# Patient Record
Sex: Male | Born: 2000 | Race: White
Health system: Midwestern US, Academic
[De-identification: ages and names within clinical notes are randomized; demographics above are authoritative.]

---
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
## 2020-01-30 ENCOUNTER — Inpatient Hospital Stay: Admit: 2020-01-30 | Payer: MEDICAID

## 2020-01-30 ENCOUNTER — Encounter: Admit: 2020-01-30 | Discharge: 2020-01-30 | Payer: PRIVATE HEALTH INSURANCE

## 2020-01-30 DIAGNOSIS — R079 Chest pain, unspecified: Secondary | ICD-10-CM

## 2020-01-30 LAB — OPIATES-URINE RANDOM: Lab: NEGATIVE

## 2020-01-30 LAB — BENZODIAZEPINES-URINE RANDOM: Lab: NEGATIVE

## 2020-01-30 LAB — BARBITURATES-URINE RANDOM: Lab: NEGATIVE

## 2020-01-30 LAB — TSH WITH FREE T4 REFLEX: Lab: 3.5 uU/mL (ref 0.35–5.00)

## 2020-01-30 LAB — TROPONIN-I
Lab: 2.2 ng/mL — ABNORMAL HIGH (ref 0.0–0.05)
Lab: 1.8 ng/mL — ABNORMAL HIGH (ref 0.0–0.05)
Lab: 3.6 ng/mL — ABNORMAL HIGH (ref 0.0–0.05)

## 2020-01-30 LAB — PHENCYCLIDINES-URINE RANDOM: Lab: NEGATIVE

## 2020-01-30 LAB — COCAINE-URINE RANDOM: Lab: NEGATIVE

## 2020-01-30 LAB — CBC AND DIFF
Lab: 0 % (ref >60)
Lab: 0 10*3/uL (ref 0–0.20)
Lab: 0 10*3/uL (ref 0–0.45)
Lab: 1 % (ref >60)
Lab: 1.6 10*3/uL (ref 1.0–4.8)
Lab: 11 K/UL — ABNORMAL HIGH (ref 4.5–11.0)
Lab: 13 % (ref 11–15)
Lab: 14 % — ABNORMAL LOW (ref 24–44)
Lab: 14 g/dL (ref 13.5–16.5)
Lab: 199 K/UL (ref 150–400)
Lab: 2.3 10*3/uL — ABNORMAL HIGH (ref 0–0.80)
Lab: 20 % — ABNORMAL HIGH (ref 4–12)
Lab: 30 pg — ABNORMAL HIGH (ref 26–34)
Lab: 34 g/dL (ref 32.0–36.0)
Lab: 4.7 M/UL (ref 4.4–5.5)
Lab: 65 % — ABNORMAL LOW (ref 41–77)
Lab: 7.8 10*3/uL — ABNORMAL HIGH (ref 1.8–7.0)
Lab: 8.7 FL (ref 7–11)
Lab: 90 FL (ref 80–100)

## 2020-01-30 LAB — CANNABINOIDS-URINE RANDOM: Lab: POSITIVE — AB

## 2020-01-30 LAB — BNP (B-TYPE NATRIURETIC PEPTI): Lab: 46 pg/mL (ref 0–100)

## 2020-01-30 LAB — C REACTIVE PROTEIN (CRP): Lab: 12 mg/dL — ABNORMAL HIGH (ref <1.0)

## 2020-01-30 LAB — PROCALCITONIN: Lab: 0 ng/mL

## 2020-01-30 LAB — SED RATE: Lab: 45 mm/h — ABNORMAL HIGH (ref 0–15)

## 2020-01-30 LAB — MAGNESIUM: Lab: 2.1 mg/dL (ref 1.6–2.6)

## 2020-01-30 LAB — COMPREHENSIVE METABOLIC PANEL
Lab: 137 MMOL/L (ref 137–147)
Lab: 4 MMOL/L (ref 3.5–5.1)

## 2020-01-30 LAB — AMPHETAMINES-URINE RANDOM: Lab: NEGATIVE

## 2020-01-30 MED ORDER — IBUPROFEN 100 MG/5 ML PO SUSP
800 mg | Freq: Three times a day (TID) | ORAL | 0 refills | Status: DC
Start: 2020-01-30 — End: 2020-01-30

## 2020-01-30 MED ORDER — IBUPROFEN 600 MG PO TAB
600 mg | Freq: Three times a day (TID) | ORAL | 0 refills | Status: AC
Start: 2020-01-30 — End: ?
  Administered 2020-01-30 – 2020-02-04 (×15): 600 mg via ORAL

## 2020-01-30 MED ORDER — FLU VACC QS2021-22 6MOS UP(PF) 60 MCG (15 MCG X 4)/0.5 ML IM SYRG
.5 mL | Freq: Once | INTRAMUSCULAR | 0 refills | Status: AC
Start: 2020-01-30 — End: ?

## 2020-01-30 MED ORDER — ENOXAPARIN 40 MG/0.4 ML SC SYRG
40 mg | Freq: Every day | SUBCUTANEOUS | 0 refills | Status: AC
Start: 2020-01-30 — End: ?
  Administered 2020-01-30 – 2020-02-04 (×6): 40 mg via SUBCUTANEOUS

## 2020-01-30 MED ORDER — FENTANYL CITRATE (PF) 50 MCG/ML IJ SOLN
25 ug | Freq: Once | INTRAVENOUS | 0 refills | Status: CP
Start: 2020-01-30 — End: ?
  Administered 2020-01-30: 22:00:00 25 ug via INTRAVENOUS

## 2020-01-30 MED ORDER — COLCHICINE 0.6 MG PO TAB
0.6 mg | Freq: Two times a day (BID) | ORAL | 0 refills | Status: AC
Start: 2020-01-30 — End: ?
  Administered 2020-01-30 – 2020-02-04 (×11): 0.6 mg via ORAL

## 2020-01-30 MED ORDER — PANTOPRAZOLE 40 MG PO TBEC
40 mg | Freq: Every day | ORAL | 0 refills | Status: AC
Start: 2020-01-30 — End: ?
  Administered 2020-01-31 – 2020-02-04 (×4): 40 mg via ORAL

## 2020-01-30 NOTE — Progress Notes
The patient was seen at [facility] in 07/2018 for similar sx's, they patient did not follow up as directed.  Has had chest pain intermittently since then.  More constant the past 2 days.  EKG: early repole changes in V1-2-3, st elevation present but better than last EKG, I requested EKG be clouded.  AAOX3  116/78 109 17 98% on RA 100.1  COVID is pending- sending concerned the patient may have COVID, I explained we do not have any COVID beds so will need to wait for COVID result.  CXR-right lower lobe gorund glass opacity  WBC-14,1  H/H-15/43 Plt ct-194  Trop-0.335 (their norm is <0.033)  NA-137 K-3.8 CL-102 CO2-21 Gap-18 BUN-10 Creat-0.98  TX: IVF  HX: myopericarditis; vaping

## 2020-01-30 NOTE — H&P (View-Only)
Admission History and Physical Examination      Name:  Brandon Harmon                                             MRN:  [id number]   Admission Date:  01/30/2020                     Assessment/Plan:    Principal Problem:    Chest pain    Mr. Caffrey is a 19 year old gentlemen with past medical history of myopericarditis who present to [facility] via transfer p/w chest pain and elevated troponins     Chest pain  Elevated troponin  Hx myopericarditis  - OSH labs: trop 0.335 (norm is <0.033)  - OSH imaging:   CXR-right lower lobe groundglass opacity   CTA chest negative for PE or aortic dissection  - Admitted with similar presentation in April of 2020 found to have myopericarditis. Workup included ESR of 10, CRP 3.14, negative HIV, negative RVP, negative COVID-19, ANA < 80  - Treated with aspirin; started on metoprolol 12.5 mg twice daily  - Echocardiogram 08/27/18 - LVEF 55%. No major regional wall motion abnormalities seen, cannot exclude mild lateral hypokinesis.  - ESR 45, CRP 12.28  - TSH wnl  - UDS pending  - Trop 2.29  - EKG 10/1@0616 : Rate 72, sinus rhythm, normal axis, normal intervals. No evidence of ischemia  Plan  > UDS  > Trend trops  > Cardiac MRI to evaluate for evidence of myopericarditis  > Empirically treat for myopericarditis with ibuprofen 600mg  TID, colchicine 0.6mg  BID    Leukocytosis  Lymphadenopathy  RLL consolidation  - Presented to OSH with chills, body aches, sore throat, lymphadenopathy  - WBC 14.1 w neutrophilia at OSH  - ESR 45, CRP 12.28  Plan  > RVP  > Procalcitonin  > will hold on starting antibiotics until further workup    FEN: No IVF; replace PRN; cardiac diet  CODE: FULL CODE  DVT: enoxaparin  Dispo: admit to [facility]   __________________________________________________________________________________  Primary Care Physician: No Pcp, Na  Verified    Chief Complaint:  Chest pain  History of Present Illness: Brandon Harmon is a 19 y.o. gentlemen with past medical history of myopericarditis who present to [facility] via transfer with complaints of chest pains and elevated troponins.     He presented to OSH feeling run down with several days of chills, body aches, sore throat, swollen neck glands, as well as ongoing chest pain for 1.5 months that worsened in the last few days. His work-up was notable for WBC 14.1, elevated troponin to 0.335 (normal 0.0-0.033), elevated dimer 688 with negative follow-up CTA chest, CXR with evidence of RLL developing infiltrate. EKG showed sinus tachycardia without evidence of ischemic changes.    Patient with similar presentation in April of 2020, where at that point troponins elevated to 13.98.  Extensive work-up consistent with acute myopericarditis treated with aspirin.  Unfortunately patient lost to follow-up.  He states that since that time he continued to have intermittent substernal chest pain though notes a stark worsening over the past 2 days. Admits this is similar to his previous presentation.  He describes chest pain/chest discomfort with a squeezing intensity.  Seems to be made worse when supine and relieved when leaning forward or prone. This is not associated with any dyspnea or  shortness of air.  He also denies palpitations.    He denies any significant past medical history.    He denies any significant family history of cardiac disease.  He reports occasional vaping.  No other illicit drug use.  He does not smoke tobacco.  He denies alcohol use.  Social History     Socioeconomic History   ? Marital status: Unknown     Spouse name: Not on file   ? Number of children: Not on file   ? Years of education: Not on file   ? Highest education level: Not on file   Occupational History   ? Not on file   Tobacco Use   ? Smoking status: Never Smoker   Vaping Use   ? Vaping Use: Some days   Substance and Sexual Activity   ? Alcohol use: Not Currently   ? Drug use: Not Currently   ? Sexual activity: Not on file   Other Topics Concern   ? Not on file Social History Narrative   ? Not on file     Social Determinants of Health     Financial Resource Strain:    ? Difficulty of Paying Living Expenses:    Food Insecurity:    ? Worried About Programme researcher, broadcasting/film/video in the Last Year:    ? Barista in the Last Year:    Transportation Needs:    ? Freight forwarder (Medical):    ? Lack of Transportation (Non-Medical):    Physical Activity:    ? Days of Exercise per Week:    ? Minutes of Exercise per Session:    Stress:    ? Feeling of Stress :    Social Connections:    ? Frequency of Communication with Friends and Family:    ? Frequency of Social Gatherings with Friends and Family:    ? Attends Religious Services:    ? Active Member of Clubs or Organizations:    ? Attends Banker Meetings:    ? Marital Status:    Intimate Partner Violence:    ? Fear of Current or Ex-Partner:    ? Emotionally Abused:    ? Physically Abused:    ? Sexually Abused:       Vaping/E-liquid Use   ? Vaping Use Current Some Day User                 Immunizations (includes history and patient reported):   There is no immunization history on file for this patient.        Allergies:  Patient has no allergy information on record.    Medications:  No medications prior to admission.     Current Facility-Administered Medications   Medication   ? colchicine (COLCRYS) tablet 0.6 mg   ? enoxaparin (LOVENOX) syringe 40 mg   ? ibuprofen (MOTRIN) tablet 600 mg   ? influenza (=>6 MO) (QUADrivalent) (FLULAVAL) 60 mcg (15 mcg x 4)/0.5 mL 2021-22 PF syringe 0.5 mL   ? pantoprazole DR (PROTONIX) tablet 40 mg     Review of Systems:  All other systems reviewed and are negative.    Physical Exam:  Vital Signs: Last Filed In 24 Hours Vital Signs: 24 Hour Range   BP: 117/80 (10/01 0559)  Temp: 36.7 ?C (98.1 ?F) (10/01 0559)  Pulse: 78 (10/01 0559)  Respirations: 16 PER MINUTE (10/01 0559)  Height: 177.8 cm (70) (10/01 0559) BP: (117)/(80)   Temp:  [36.7 ?  C (98.1 ?F)]   Pulse:  [78]   Respirations: [16 PER MINUTE]           Physical Exam  Vitals and nursing note reviewed.   Constitutional:       General: He is not in acute distress.     Appearance: Normal appearance.   HENT:      Head: Normocephalic and atraumatic.   Eyes:      General: No scleral icterus.     Pupils: Pupils are equal, round, and reactive to light.   Neck:      Vascular: No JVD.   Cardiovascular:      Rate and Rhythm: Normal rate and regular rhythm.      Heart sounds: No murmur heard.   No friction rub. No gallop.    Pulmonary:      Effort: Pulmonary effort is normal. No respiratory distress.      Breath sounds: No wheezing or rales.   Chest:      Chest wall: No tenderness.   Abdominal:      General: Bowel sounds are normal. There is no distension.      Palpations: Abdomen is soft.      Tenderness: There is no abdominal tenderness.   Musculoskeletal:         General: No swelling or tenderness.      Cervical back: Normal range of motion and neck supple.   Lymphadenopathy:      Cervical: Cervical adenopathy present.   Skin:     General: Skin is warm and dry.   Neurological:      General: No focal deficit present.      Mental Status: He is alert and oriented to person, place, and time. Mental status is at baseline.      Cranial Nerves: No cranial nerve deficit.      Sensory: No sensory deficit.      Motor: No weakness.   Psychiatric:         Mood and Affect: Affect normal.         Judgment: Judgment normal.           Lab/Radiology/Other Diagnostic Tests:  24-hour labs:    Results for orders placed or performed during the hospital encounter of 01/30/20 (from the past 24 hour(s))   BNP (B-TYPE NATRIURETIC PEPTI)    Collection Time: 01/30/20  6:55 AM   Result Value Ref Range    B Type Natriuretic Peptide 46.0 0 - 100 PG/ML   C REACTIVE PROTEIN (CRP)    Collection Time: 01/30/20  6:55 AM   Result Value Ref Range    C-Reactive Protein 12.28 (H) <1.0 MG/DL   SED RATE    Collection Time: 01/30/20  6:55 AM   Result Value Ref Range    Sed Rate -ESR 45 (H) 0 - 15 MM/HR   TSH WITH FREE T4 REFLEX    Collection Time: 01/30/20  6:55 AM   Result Value Ref Range    TSH 3.51 0.35 - 5.00 MCU/ML   TROPONIN-I    Collection Time: 01/30/20  6:55 AM   Result Value Ref Range    Troponin-I 2.29 (H) 0.0 - 0.05 NG/ML        Pertinent radiology reviewed., EKG Reviewed    [place], MD

## 2020-01-31 ENCOUNTER — Inpatient Hospital Stay: Admit: 2020-01-31 | Discharge: 2020-01-31 | Payer: PRIVATE HEALTH INSURANCE

## 2020-01-31 LAB — TROPONIN-I: Lab: 6.3 ng/mL — ABNORMAL HIGH (ref 0.0–0.05)

## 2020-01-31 MED ADMIN — GADOBENATE DIMEGLUMINE 529 MG/ML (0.1MMOL/0.2ML) IV SOLN [135881]: 15 mL | INTRAVENOUS | @ 15:00:00 | Stop: 2020-01-31 | NDC 00270516414

## 2020-02-01 ENCOUNTER — Encounter: Admit: 2020-02-01 | Discharge: 2020-02-01

## 2020-02-01 MED ADMIN — POTASSIUM CHLORIDE 20 MEQ PO TBTQ [35943]: 40 meq | ORAL | @ 12:00:00 | Stop: 2020-02-01 | NDC 00245531989

## 2020-02-01 MED ADMIN — PHENOL 1.4 % MM SPRA [82893]: 2 | OROMUCOSAL | @ 20:00:00 | NDC 00904630521

## 2020-02-01 MED ADMIN — LIDOCAINE 5 % TP PTMD [80759]: 1 | TOPICAL | @ 18:00:00 | NDC 00591352530

## 2020-02-01 MED ADMIN — ACETAMINOPHEN 325 MG PO TAB [101]: 650 mg | ORAL | @ 18:00:00 | NDC 00904677361

## 2020-02-02 ENCOUNTER — Inpatient Hospital Stay: Admit: 2020-02-02 | Discharge: 2020-02-02 | Payer: PRIVATE HEALTH INSURANCE

## 2020-02-03 ENCOUNTER — Encounter: Admit: 2020-02-03 | Discharge: 2020-02-03 | Payer: PRIVATE HEALTH INSURANCE

## 2020-02-03 ENCOUNTER — Inpatient Hospital Stay: Admit: 2020-02-03 | Discharge: 2020-02-03 | Payer: PRIVATE HEALTH INSURANCE

## 2020-02-03 MED ORDER — PHENYLEPHRINE HCL IN 0.9% NACL 1 MG/10 ML (100 MCG/ML) IV SYRG
INTRAVENOUS | 0 refills | Status: DC
Start: 2020-02-03 — End: 2020-02-03
  Administered 2020-02-03 (×5): 100 ug via INTRAVENOUS

## 2020-02-03 MED ORDER — DEXMEDETOMIDINE IN 0.9 % NACL 20 MCG/5 ML (4 MCG/ML) IV SYRG
INTRAVENOUS | 0 refills | Status: DC
Start: 2020-02-03 — End: 2020-02-03
  Administered 2020-02-03: 21:00:00 4 ug via INTRAVENOUS
  Administered 2020-02-03 (×2): 8 ug via INTRAVENOUS

## 2020-02-03 MED ORDER — ONDANSETRON HCL (PF) 4 MG/2 ML IJ SOLN
INTRAVENOUS | 0 refills | Status: DC
Start: 2020-02-03 — End: 2020-02-03
  Administered 2020-02-03: 20:00:00 4 mg via INTRAVENOUS

## 2020-02-03 MED ORDER — DEXAMETHASONE SODIUM PHOSPHATE 4 MG/ML IJ SOLN
INTRAVENOUS | 0 refills | Status: DC
Start: 2020-02-03 — End: 2020-02-03
  Administered 2020-02-03: 20:00:00 4 mg via INTRAVENOUS

## 2020-02-03 MED ORDER — MIDAZOLAM 1 MG/ML IJ SOLN
INTRAVENOUS | 0 refills | Status: DC
Start: 2020-02-03 — End: 2020-02-03
  Administered 2020-02-03: 20:00:00 2 mg via INTRAVENOUS

## 2020-02-03 MED ORDER — ROCURONIUM 10 MG/ML IV SOLN GROUP
INTRAVENOUS | 0 refills | Status: DC
Start: 2020-02-03 — End: 2020-02-03
  Administered 2020-02-03: 20:00:00 50 mg via INTRAVENOUS

## 2020-02-03 MED ORDER — SUGAMMADEX 100 MG/ML IV SOLN
INTRAVENOUS | 0 refills | Status: DC
Start: 2020-02-03 — End: 2020-02-03
  Administered 2020-02-03: 20:00:00 160 mg via INTRAVENOUS

## 2020-02-03 MED ORDER — ARTIFICIAL TEARS SINGLE DOSE DROPS GROUP
OPHTHALMIC | 0 refills | Status: DC
Start: 2020-02-03 — End: 2020-02-03
  Administered 2020-02-03: 20:00:00 1 [drp] via OPHTHALMIC

## 2020-02-03 MED ORDER — CEFAZOLIN 1 GRAM IJ SOLR
INTRAVENOUS | 0 refills | Status: DC
Start: 2020-02-03 — End: 2020-02-03
  Administered 2020-02-03: 20:00:00 2 g via INTRAVENOUS

## 2020-02-03 MED ORDER — FENTANYL CITRATE (PF) 50 MCG/ML IJ SOLN
INTRAVENOUS | 0 refills | Status: DC
Start: 2020-02-03 — End: 2020-02-03
  Administered 2020-02-03: 20:00:00 100 ug via INTRAVENOUS

## 2020-02-03 MED ORDER — PROPOFOL INJ 10 MG/ML IV VIAL
INTRAVENOUS | 0 refills | Status: DC
Start: 2020-02-03 — End: 2020-02-03
  Administered 2020-02-03: 20:00:00 200 mg via INTRAVENOUS

## 2020-02-03 MED ORDER — LIDOCAINE (PF) 200 MG/10 ML (2 %) IJ SYRG
INTRAVENOUS | 0 refills | Status: DC
Start: 2020-02-03 — End: 2020-02-03
  Administered 2020-02-03: 20:00:00 80 mg via INTRAVENOUS

## 2020-02-03 MED ADMIN — BUPIVACAINE HCL 0.25 % (2.5 MG/ML) IJ SOLN [1222]: 5 mL | INTRAMUSCULAR | @ 21:00:00 | Stop: 2020-02-03 | NDC 63323046501

## 2020-02-03 MED ADMIN — LACTATED RINGERS IV SOLP [4318]: 1000 mL | INTRAVENOUS | @ 19:00:00 | Stop: 2020-02-03 | NDC 00338011704

## 2020-02-03 MED ADMIN — ACETAMINOPHEN 500 MG PO TAB [102]: 1000 mg | ORAL | @ 19:00:00 | Stop: 2020-02-03 | NDC 00904673061

## 2020-02-03 MED ADMIN — LIDOCAINE HCL 10 MG/ML (1 %) IJ SOLN [4452]: 5 mL | INTRAMUSCULAR | @ 21:00:00 | Stop: 2020-02-03 | NDC 00409427617

## 2020-02-03 NOTE — Anesthesia Post-Procedure Evaluation
Post-Anesthesia Evaluation    Name: Brandon Harmon      MRN: [id number]     DOB: [birth year]     Age: 19 y.o.     Sex: male   __________________________________________________________________________     Procedure Information     Anesthesia Start Date/Time: 02/03/20 1437    Procedure: BIOPSY/ EXCISION LYMPH NODE DEEP CERVICAL (Left Neck) - billing area: [facility]    Location: MAIN OR 06 / Main OR/Periop    Surgeons: Robbi Garter, MD          Post-Anesthesia Vitals  BP: 106/71 (10/05 1641)  Temp: 36.6 ?C (97.9 ?F) (10/05 1556)  Pulse: 75 (10/05 1630)  Respirations: 15 PER MINUTE (10/05 1630)  SpO2: 99 % (10/05 1630)  SpO2 Pulse: 75 (10/05 1630)   Vitals Value Taken Time   BP 106/71 02/03/20 1641   Temp 36.6 ?C (97.9 ?F) 02/03/20 1556   Pulse 75 02/03/20 1630   Respirations 15 PER MINUTE 02/03/20 1630   SpO2 99 % 02/03/20 1630   ABP     ART BP           Post Anesthesia Evaluation Note    Evaluation location: Pre/Post  Patient participation: recovered; patient participated in evaluation  Level of consciousness: alert    Pain score: 0  Pain management: adequate    Hydration: normovolemia  Temperature: 36.0?C - 38.4?C  Airway patency: adequate    Perioperative Events       Post-op nausea and vomiting: no PONV    Postoperative Status  Cardiovascular status: hemodynamically stable  Respiratory status: spontaneous ventilation  Follow-up needed: none        Perioperative Events  Perioperative Event: No  Emergency Case Activation: No

## 2020-02-03 NOTE — Research Notes
Study Title: Histoplasmosis IGRA  HSC/IRB Number: [id number]  Consent Version Date: 09/12/2019  Consent Type: Initial    Clinical trial participation and research nature of the trial were discussed with subject during this visit. Subject was alert and oriented during consent discussion. Subject was informed that clinical trial is voluntary and he may withdraw consent at any time for any reason by notifying study team. Study purpose, procedures, tests, samples to be obtained, potential side effects, benefits, foreseeable risks and duration of study were discussed. HIPAA information, and compensation were discussed per consent form. There is no need for insurance pre-certification alternative courses of treatment were not discussed as this study involves no treatment. Subject verbalized understanding.     Subject was given time to review the consent form and to discuss participation in this study. Questions asked were answered to his satisfaction and subject voiced desire to sign consent today. Subject signed consent without coercion and undue influence. A copy of the signed consent was given to the subject and emailed  Health Information Management (HIM) for scanning into the subject's medical record in Epic O[employer]. Contact information for the study team was given to the subject.     No research specific procedures took place prior to consenting.

## 2020-02-03 NOTE — Anesthesia Pre-Procedure Evaluation
Anesthesia Pre-Procedure Evaluation    Name: Brandon Harmon      MRN: [id number]     DOB: 2000/11/26     Age: 19 y.o.     Sex: male   _________________________________________________________________________     Procedure Info:   Procedure Information     Date/Time: 02/03/20 1410    Procedure: BIOPSY/ EXCISION LYMPH NODE DEEP CERVICAL (Left )    Location: MAIN OR 06 / Main OR/Periop    Surgeons: Robbi Garter, MD          Physical Assessment  Vital Signs (last filed in past 24 hours):  BP: 122/74 (10/05 1322)  Temp: 36.7 ?C (98.1 ?F) (10/05 1322)  Pulse: 82 (10/05 1322)  Respirations: 13 PER MINUTE (10/05 1322)  SpO2: 100 % (10/05 1322)      Patient History   No Known Allergies     Current Medications    Not on File         Review of Systems/Medical History        PONV Screening: Non-smoker  No history of anesthetic complications      Airway - negative        Pulmonary       Current smoker (once every two weeks)        No indications/hx of asthma    no COPD      No sleep apnea      Cardiovascular         Exercise tolerance: >4 METS      Beta Blocker therapy: No      No hypertension,       No past MI,       No PTCA      No angina      Myopercarditis, elevated troponins on admission      GI/Hepatic/Renal         No GERD,       No hx of liver disease     No renal disease      Neuro/Psych       No seizures      No CVA      Musculoskeletal - negative        Endocrine/Other       No diabetes   Physical Exam    Airway Findings      Mallampati: II      TM distance: >3 FB      Neck ROM: full      Mouth opening: good    Cardiovascular Findings:       Rhythm: regular      Rate: normal    Pulmonary Findings:       Breath sounds clear to auscultation.    Abdominal Findings:       Not obese    Neurological Findings:       Alert and oriented x 3    Constitutional findings:       No acute distress       Diagnostic Tests  Hematology:   Lab Results   Component Value Date    HGB 13.3 02/03/2020    HCT 39.3 02/03/2020    PLTCT 267 02/03/2020    WBC 8.6 02/03/2020    NEUT 61 02/03/2020    ANC 5.26 02/03/2020    ALC 2.02 02/03/2020    MONA 14 02/03/2020    AMC 1.20 02/03/2020    EOSA 1 02/03/2020    ABC 0.04 02/03/2020    MCV 91.0 02/03/2020  MCH 30.7 02/03/2020    MCHC 33.8 02/03/2020    MPV 7.8 02/03/2020    RDW 13.5 02/03/2020         General Chemistry:   Lab Results   Component Value Date    NA 140 02/03/2020    K 3.8 02/03/2020    CL 105 02/03/2020    CO2 26 02/03/2020    GAP 9 02/03/2020    BUN 10 02/03/2020    CR 0.75 02/03/2020    GLU 90 02/03/2020    CA 9.0 02/03/2020    ALBUMIN 3.8 02/03/2020    MG 2.1 02/03/2020    TOTBILI 0.2 02/03/2020      Coagulation:   Lab Results   Component Value Date    PTT 29.5 08/26/2018    INR 1.0 08/26/2018         Anesthesia Plan    ASA score: 2   Plan: general and MAC  Induction method: intravenous  NPO status: acceptable      Informed Consent  Anesthetic plan and risks discussed with patient.  Use of blood products discussed with patient  Blood Consent: consented      Plan discussed with: anesthesiologist and CRNA.

## 2020-02-04 ENCOUNTER — Encounter: Admit: 2020-02-04 | Discharge: 2020-02-04 | Payer: PRIVATE HEALTH INSURANCE

## 2020-02-04 MED ADMIN — ERGOCALCIFEROL (VITAMIN D2) 1,250 MCG (50,000 UNIT) PO CAP [81876]: 50000 [IU] | ORAL | @ 15:00:00 | Stop: 2020-02-04 | NDC 64380073706

## 2020-02-04 MED FILL — IBUPROFEN 600 MG PO TAB: 600 mg | ORAL | 14 days supply | Qty: 28 | Fill #1 | Status: CP

## 2020-02-04 MED FILL — ERGOCALCIFEROL (VITAMIN D2) 1,250 MCG (50,000 UNIT) PO CAP: 1250 mcg (50,000 unit) | ORAL | 28 days supply | Qty: 12 | Fill #1 | Status: CP

## 2020-02-04 MED FILL — COLCHICINE 0.6 MG PO TAB: 0.6 mg | ORAL | 30 days supply | Qty: 180 | Fill #1 | Status: CP

## 2020-02-04 MED FILL — PANTOPRAZOLE 40 MG PO TBEC: 40 mg | ORAL | 14 days supply | Qty: 14 | Fill #1 | Status: CP

## 2020-02-05 ENCOUNTER — Encounter: Admit: 2020-02-05 | Discharge: 2020-02-05 | Payer: PRIVATE HEALTH INSURANCE

## 2020-02-11 ENCOUNTER — Encounter: Admit: 2020-02-11 | Discharge: 2020-02-11 | Payer: PRIVATE HEALTH INSURANCE

## 2020-02-12 ENCOUNTER — Encounter: Admit: 2020-02-12 | Discharge: 2020-02-12 | Payer: PRIVATE HEALTH INSURANCE

## 2020-02-12 NOTE — Telephone Encounter
02/12/20 called pt to get records informational. All numbers in chart have been disconnected edh

## 2020-02-19 ENCOUNTER — Encounter: Admit: 2020-02-19 | Discharge: 2020-02-19 | Payer: PRIVATE HEALTH INSURANCE

## 2020-02-19 DIAGNOSIS — R590 Localized enlarged lymph nodes: Secondary | ICD-10-CM

## 2020-02-19 DIAGNOSIS — I319 Disease of pericardium, unspecified: Secondary | ICD-10-CM

## 2020-02-20 ENCOUNTER — Ambulatory Visit: Admit: 2020-02-20 | Discharge: 2020-02-20 | Payer: PRIVATE HEALTH INSURANCE

## 2020-02-20 ENCOUNTER — Encounter: Admit: 2020-02-20 | Discharge: 2020-02-20 | Payer: PRIVATE HEALTH INSURANCE

## 2020-02-26 ENCOUNTER — Encounter: Admit: 2020-02-26 | Discharge: 2020-02-26 | Payer: PRIVATE HEALTH INSURANCE

## 2020-03-05 ENCOUNTER — Encounter: Admit: 2020-03-05 | Discharge: 2020-03-05 | Payer: PRIVATE HEALTH INSURANCE

## 2020-03-08 ENCOUNTER — Encounter: Admit: 2020-03-08 | Discharge: 2020-03-08 | Payer: PRIVATE HEALTH INSURANCE

## 2020-03-11 ENCOUNTER — Encounter: Admit: 2020-03-11 | Discharge: 2020-03-11 | Payer: PRIVATE HEALTH INSURANCE

## 2020-03-11 NOTE — Telephone Encounter
-----   Message from Valora Piccolo, RN sent at 03/10/2020 12:34 PM CST -----    ----- Message -----  From: Lessie Dings, MD  Sent: 03/10/2020  12:24 PM CST  To: [facility]vm Nurse Gen Card Team [place]    Please call patient with results of his resting echo Doppler study.  Overall the echo looks very good.  He missed his appointment with me earlier this week.  Please strongly recommend that he keeps his appointment with rheumatology on Friday.  ----- Message -----  From: Vena Austria, MD  Sent: 02/20/2020   3:44 PM CST  To: Lessie Dings, MD

## 2020-03-11 NOTE — Telephone Encounter
Results and recommendations called to patient lmom requested call back if questions

## 2020-03-30 ENCOUNTER — Encounter: Admit: 2020-03-30 | Discharge: 2020-03-30 | Payer: PRIVATE HEALTH INSURANCE

## 2022-04-15 IMAGING — CR CHEST
1 series · 1 of 1 positions shown · non-contrast
Comparison: none

PROCEDURE: CHEST
HISTORY: Patient has complaints of chest pain. Former smoker. Previous CXR 08/26/2018. TB

[chest ap grid]
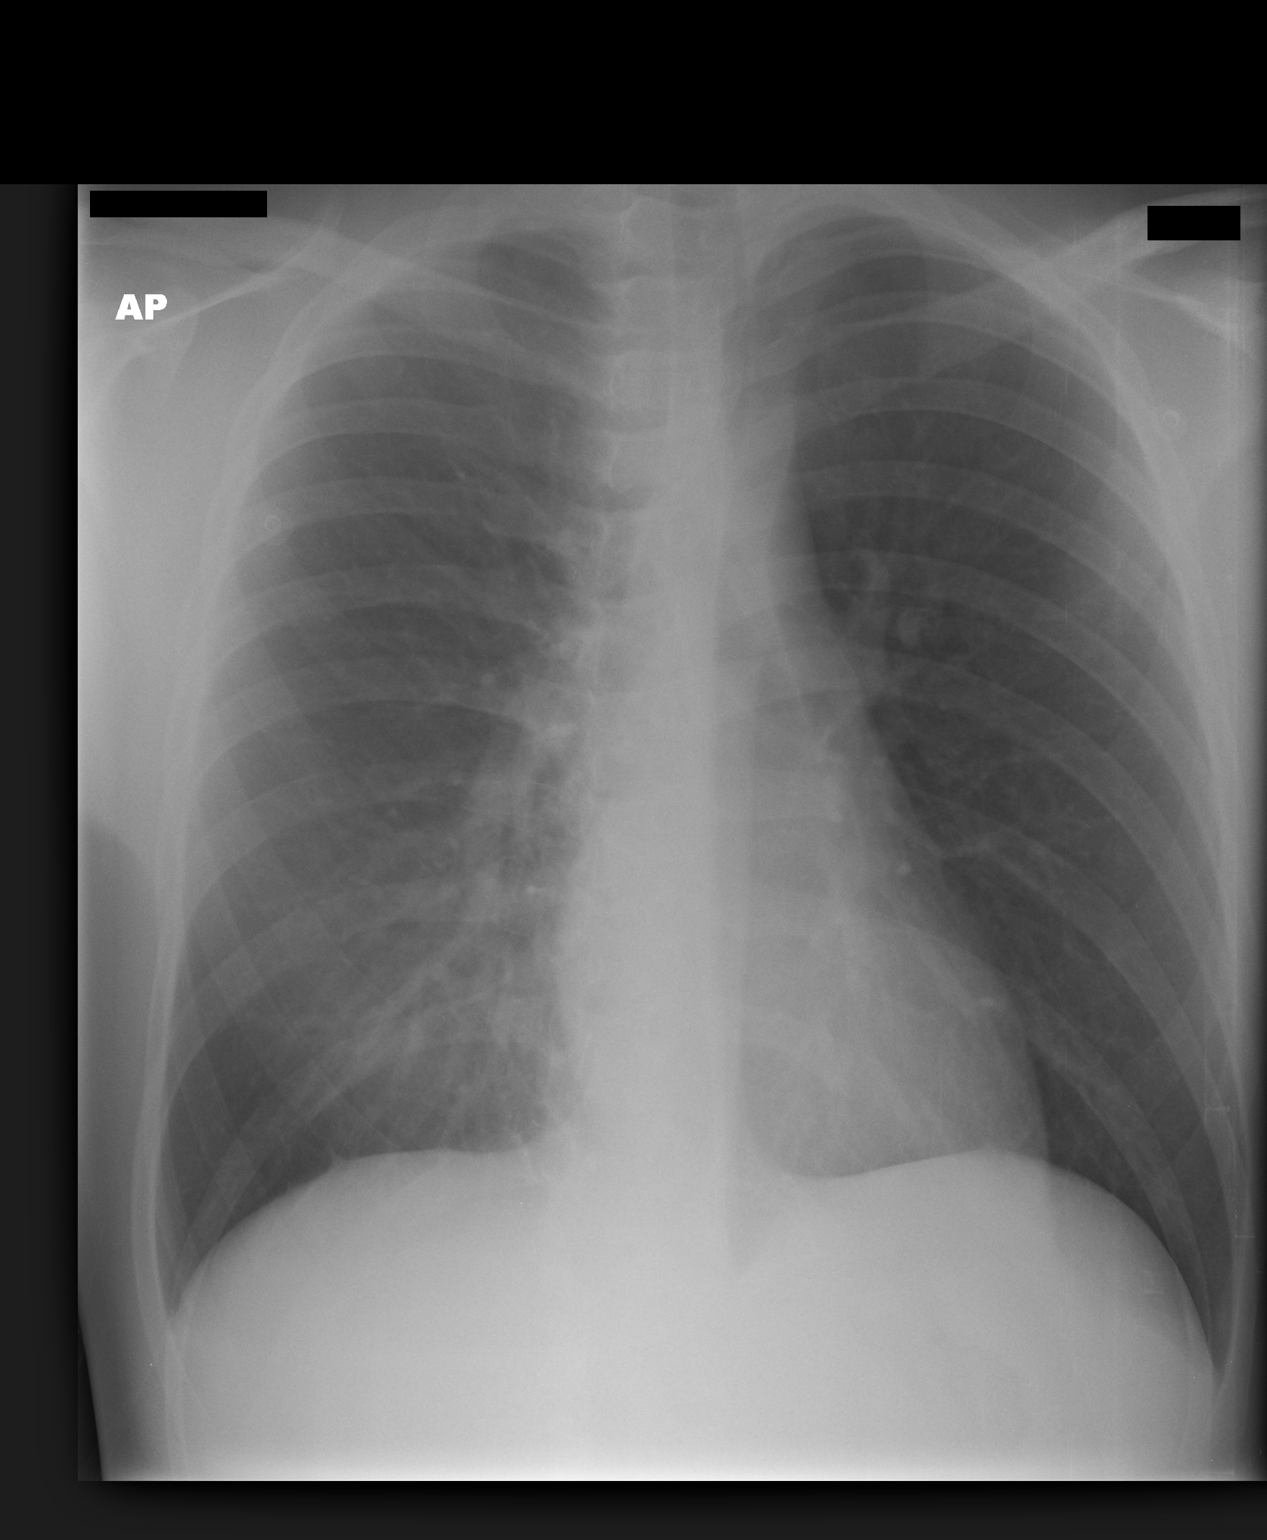

[1 of 1 positions shown; findings below may reference images not displayed]

FINDINGS: Single view of the chest without comparison shows the cardiac silhouette is normal size.
There is
no pulmonary vascular congestion seen. There are subtle groundglass airspace opacities seen in the
right
lower lobe. No pneumothorax is seen. No pleural effusion is seen. The visualized osseous structures
show
no acute findings.
IMPRESSION: 1. Right lower lobe groundglass opacities suggestive of developing infiltrate, follow up
recommended.
2. No pleural effusion.

Tech Notes:

Patient has complaints of chest pain. Former smoker. Previous CXR 08/26/2018. TB

## 2022-04-15 IMAGING — CT PE(Adult)
2 of 4 series · 15 of 36 positions shown · IV contrast (omnipaque)
Comparison: none

PROCEDURE: PE(Adult)
HISTORY: Chest pain and elevated troponin levels. Former smoker. Creat: 0.98 GFR: 126.2. Given
omnipaque 350. CT/NM 0/0. TB
TECHNIQUE: Axial CT imaging of the chest was performed with IV contrast. Comparison study 2 --.

[Series 9: cta pulmonary cor 2.00 bv36 s3 · coronal · 0.70mm/px · 3 of 129 slices shown]
[im 26/129  mediastinal]
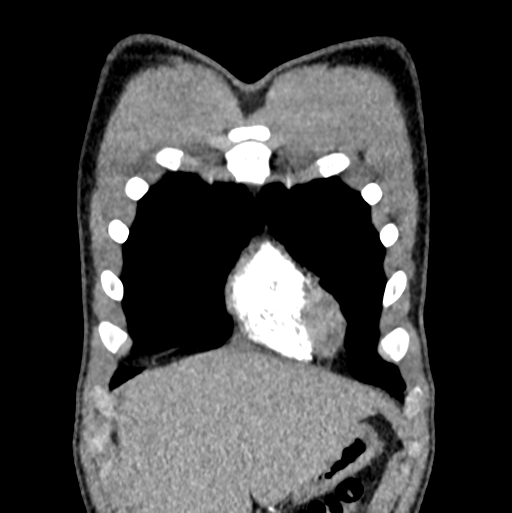
[im 52/129  mediastinal]
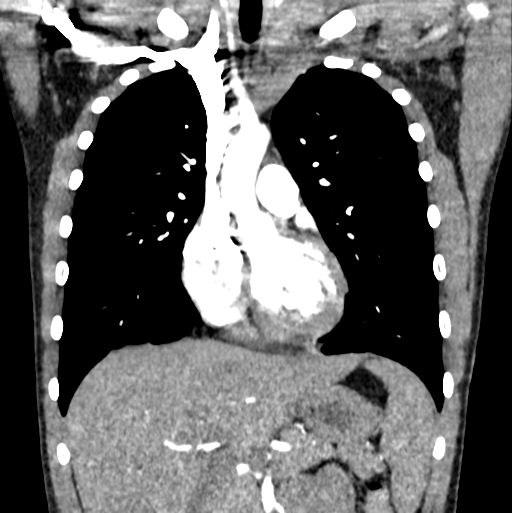
[im 77/129  mediastinal]
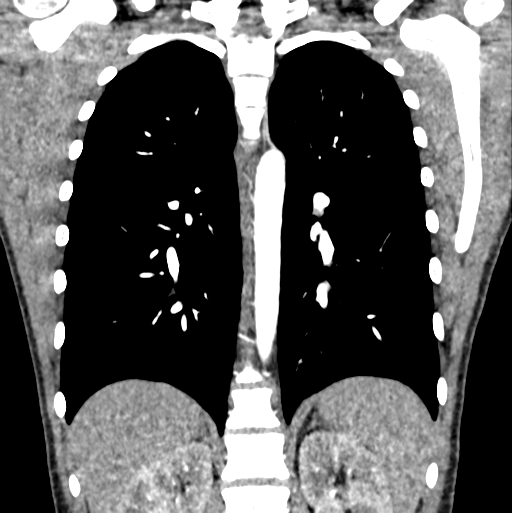

[Series 13: cta pulmonary ax 1.50 br60 s3 · axial · 0.51mm/px · z∈[+1485,+1795]mm · 12 of 232 slices shown]
[im 16/232  lung]
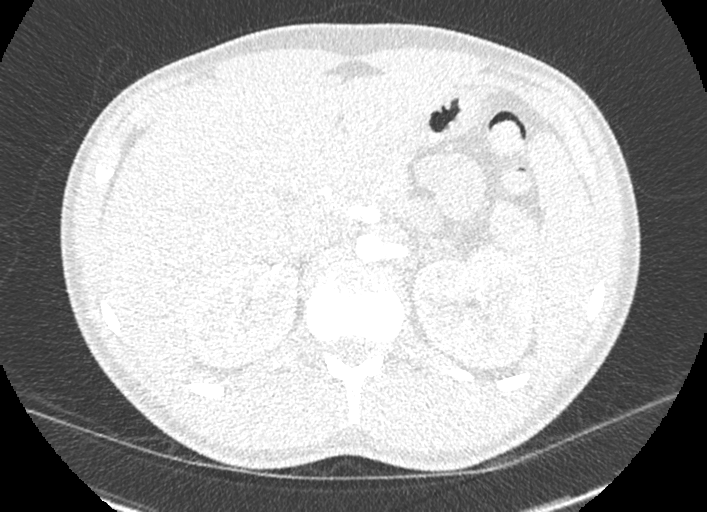
[im 31/232  mediastinal]
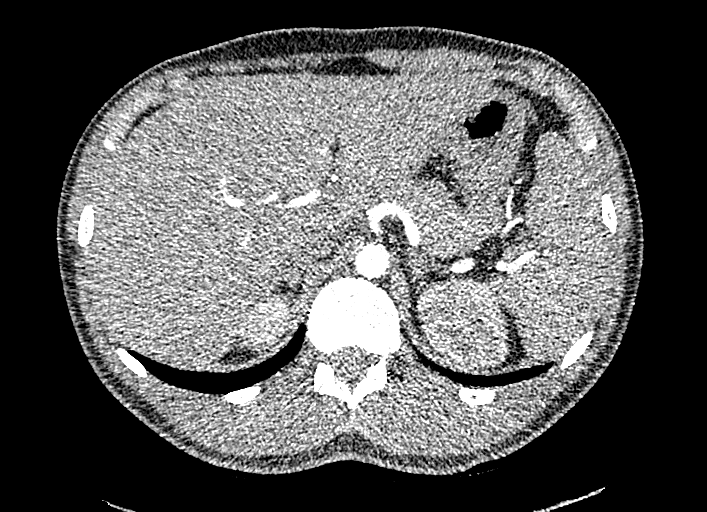
[im 47/232  lung]
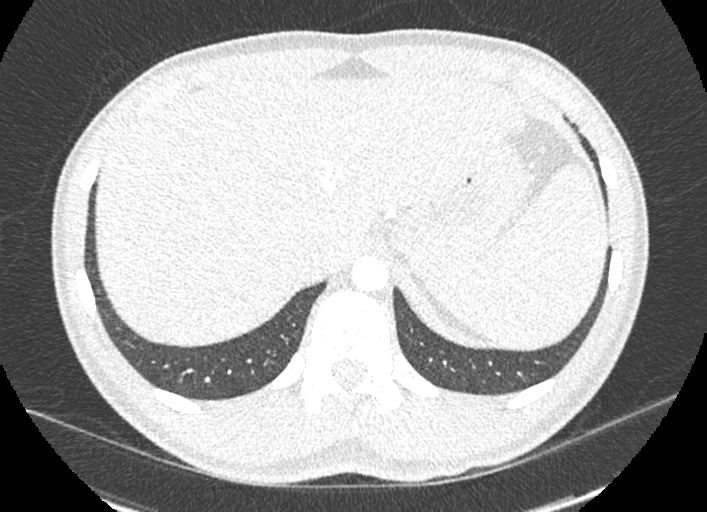
[im 78/232  mediastinal]
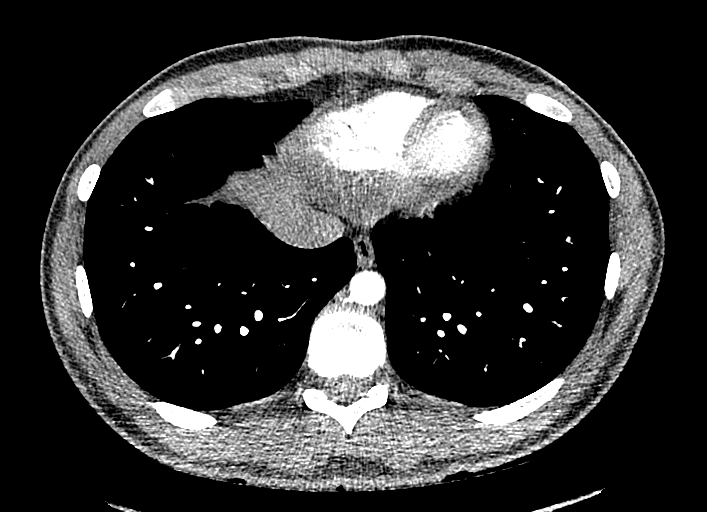
[im 93/232  lung]
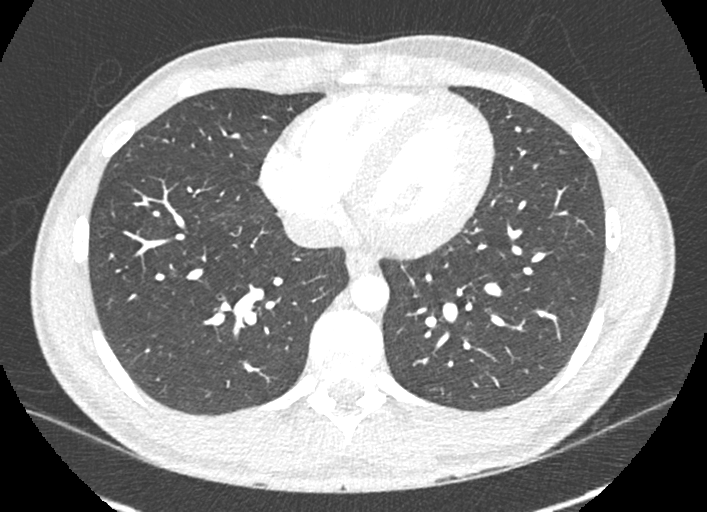
[im 108/232  mediastinal]
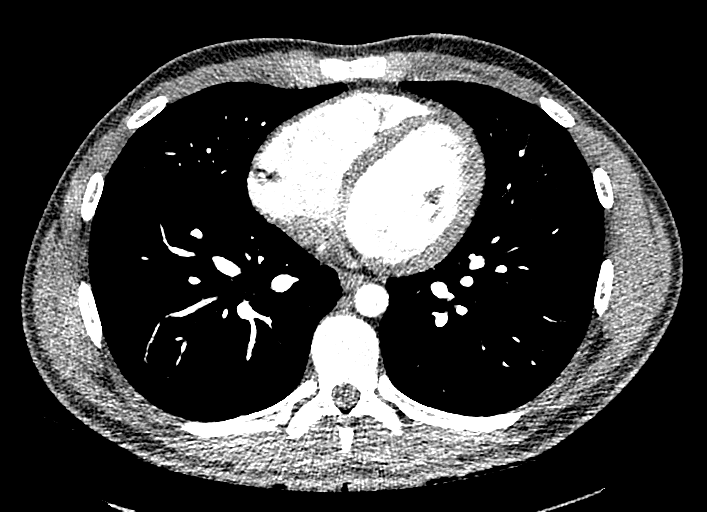
[im 124/232  lung]
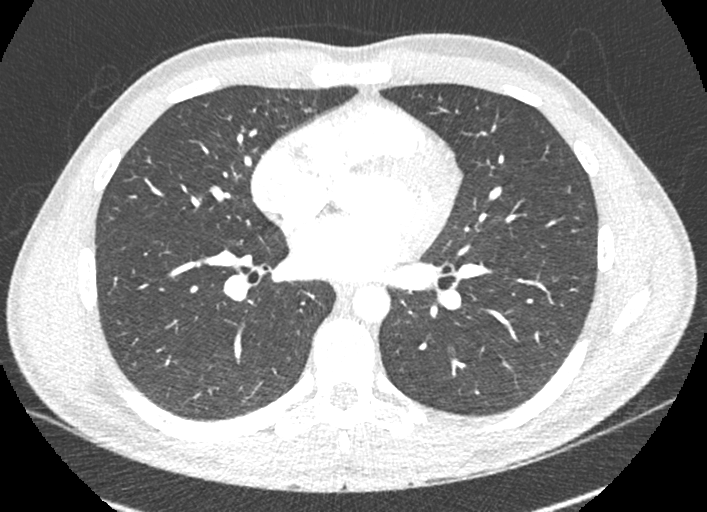
[im 139/232  mediastinal]
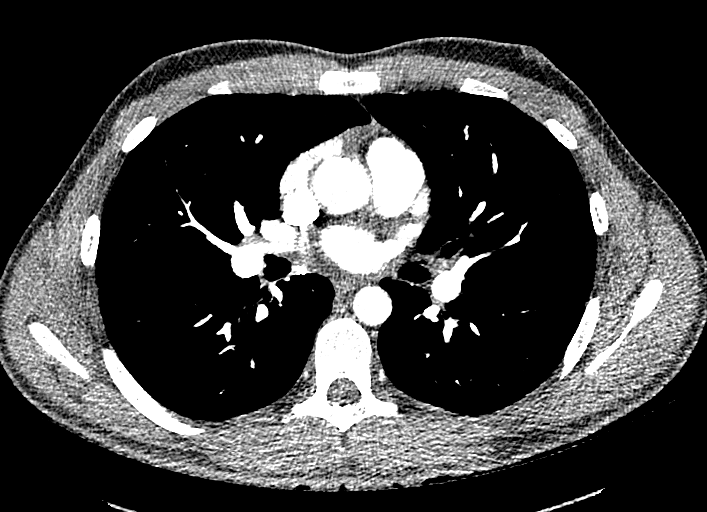
[im 155/232  lung]
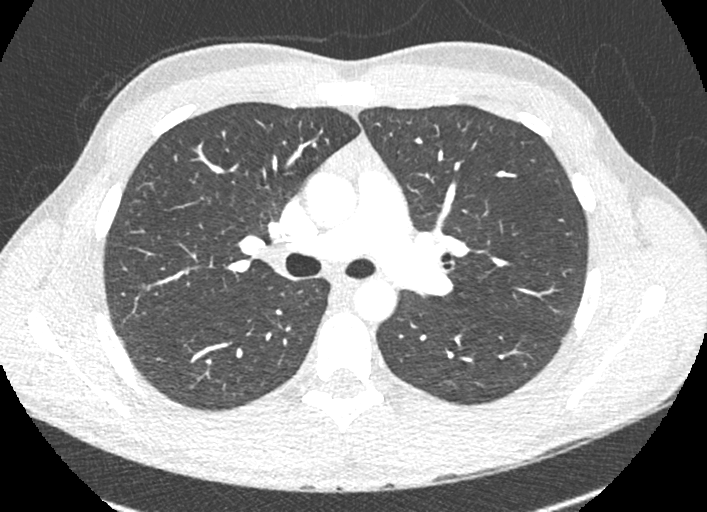
[im 185/232  mediastinal]
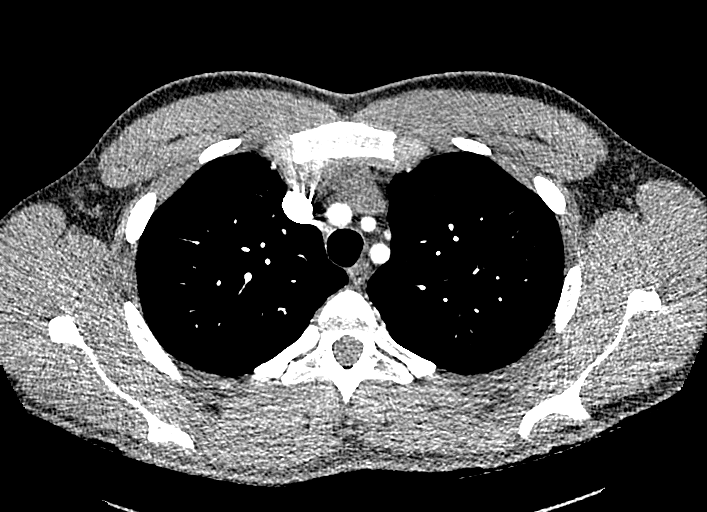
[im 201/232  lung]
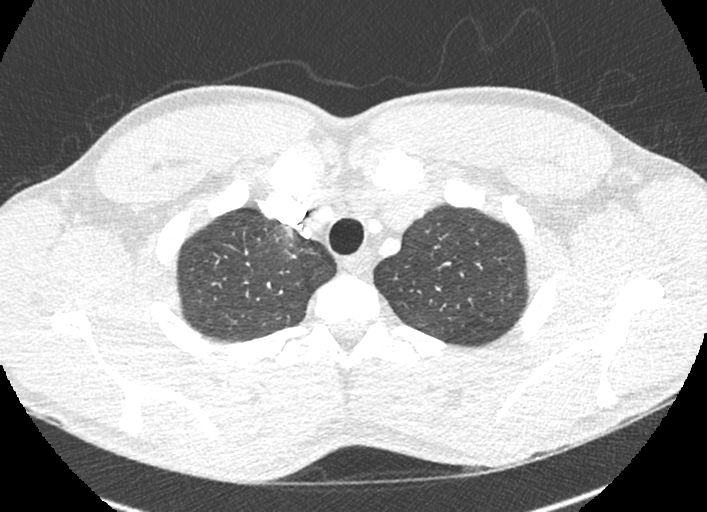
[im 216/232  mediastinal]
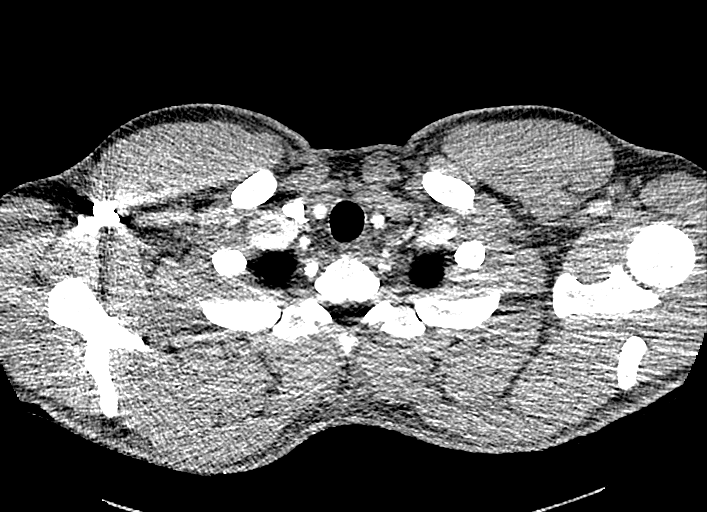

[15 of 36 positions shown; findings below may reference images not displayed]

FINDINGS: There is no pulmonary embolism or aortic dissection seen. The aorta to pulmonary artery
ratio
is greater than 1. The ventricular septum is midline in position. There is no pericardial effusion
seen. I don't
see any mediastinal adenopathy. Cardiac size is within normal limits. There is mild atherosclerotic
calcification seen of the large vessels of the mediastinum. There is no pleural effusion or
pneumothorax. The
lungs are clear without focal infiltrate or consolidation. No displaced rib fractures are seen.
There are mild
degenerative changes seen in the mid thoracic spine. Limited visualization of the upper abdomen is
unremarkable.
IMPRESSION: 1. There is no pulmonary embolism or aortic dissection seen.
2. No focal infiltrate is seen.

Tech Notes:

Chest pain and elevated troponin levels. Former smoker. Creat: 0.98 GFR: 126.2. Given 699mls
omnipaque 350. CT/NM 0/0. TB
